# Patient Record
Sex: Female | Born: 2005
Health system: Southern US, Community
[De-identification: ages and names within clinical notes are randomized; demographics above are authoritative.]

## PROBLEM LIST (undated history)

## (undated) DIAGNOSIS — E039 Hypothyroidism, unspecified: Secondary | ICD-10-CM

## (undated) DIAGNOSIS — R6250 Unspecified lack of expected normal physiological development in childhood: Secondary | ICD-10-CM

## (undated) DIAGNOSIS — E049 Nontoxic goiter, unspecified: Secondary | ICD-10-CM

## (undated) DIAGNOSIS — E11649 Type 2 diabetes mellitus with hypoglycemia without coma: Secondary | ICD-10-CM

## (undated) DIAGNOSIS — E109 Type 1 diabetes mellitus without complications: Secondary | ICD-10-CM

## (undated) DIAGNOSIS — E063 Autoimmune thyroiditis: Secondary | ICD-10-CM

## (undated) HISTORY — DX: Unspecified lack of expected normal physiological development in childhood: R62.50

## (undated) HISTORY — DX: Type 1 diabetes mellitus without complications: E10.9

## (undated) HISTORY — DX: Hypothyroidism, unspecified: E03.9

## (undated) HISTORY — DX: Autoimmune thyroiditis: E06.3

## (undated) HISTORY — PX: TYMPANOSTOMY TUBE PLACEMENT: SHX32

## (undated) HISTORY — DX: Nontoxic goiter, unspecified: E04.9

## (undated) HISTORY — DX: Type 2 diabetes mellitus with hypoglycemia without coma: E11.649

---
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
## 2024-01-23 ENCOUNTER — Encounter (INDEPENDENT_AMBULATORY_CARE_PROVIDER_SITE_OTHER): Payer: Self-pay | Admitting: Family

## 2024-01-23 ENCOUNTER — Ambulatory Visit (INDEPENDENT_AMBULATORY_CARE_PROVIDER_SITE_OTHER): Admitting: Family

## 2024-01-23 VITALS — BP 110/74 | HR 86 | Wt 110.8 lb

## 2024-01-23 DIAGNOSIS — E1065 Type 1 diabetes mellitus with hyperglycemia: Secondary | ICD-10-CM | POA: Diagnosis not present

## 2024-01-23 DIAGNOSIS — Z794 Long term (current) use of insulin: Secondary | ICD-10-CM

## 2024-01-23 MED ORDER — DEXCOM G7 SENSOR MISC: 5 refills | Status: AC

## 2024-01-23 MED ORDER — TRESIBA FLEXTOUCH 100 UNIT/ML ~~LOC~~ SOPN: PEN_INJECTOR | SUBCUTANEOUS | 2 refills | Status: AC

## 2024-01-23 MED ORDER — NOVOLOG FLEXPEN 100 UNIT/ML ~~LOC~~ SOPN: PEN_INJECTOR | SUBCUTANEOUS | 3 refills | Status: AC

## 2024-01-23 MED ORDER — TECHLITE PEN NEEDLES 31G X 5 MM MISC: 1 refills | Status: AC

## 2024-01-23 NOTE — Progress Notes (Signed)
 :[facility] Diabetes Consultation Follow-up Visit  Leah Pennington February 12, 2006 [id number]  Chief Complaint: Follow-up type 1 diabetes   Jeannine Milroy, MD   HPI: Leah Pennington  is a 18 y.o. female presenting for follow-up of type 1 diabetes. she is accompanied to this visit by her mother.  1. Leah Pennington was diagnosed with type 1 diabetes on 02/23/2009 at the age of 4-5/12. Dr. Jesse Moritz of the [facility] called our office on the evening of February 22, 2009. Parents had brought her to Dr. Trent Frizzle office that day for a 2-week history of polyuria and polydipsia, and a 2-night history of new-onset enuresis. At Dr. Trent Frizzle office, the capillary blood glucose test was 96, but the urine glucose was greater than 1000 and urine ketones were trace. Dr. Hansel Ley arranged to have blood work drawn and for the patient to come back the next morning.  It turned out that the serum glucose from the previous day was 696. We decided it was in the child's best interest to admit her to the hospital for further evaluation, management, and intensive diabetes education for new-onset T1DM. Since that time Leah Pennington has been followed in our [facility] clinic every 3-4 months.    2. Since last visit to [facility] on 10/2022 , she has been well.  No ER visits or hospitalizations.  She will be moving to [facility] in August for sonography. She is staying active with work.   She reports doing well with diabetes care and feels like the Dexcom G7 have been very helpful. She is wearing sensor more consistently since it is smaller. Takes Novolog  after eating, usually as soon as she finishes eating. Estimates eating 50-60 grams per meal. Hypoglycemia does not occur frequently, if she does go low its usually in the morning.     Insulin  regimen: 25 units of Tresiba . Novolog  120/30/6 plan.  Hypoglycemia:Able to feel low blood sugars.  No glucagon  needed recently.  Dexcom CGM   - Pattern of hyperglycemia between 4pm-9pm while she is working. She  reports this is due to eating/snacking while working and not taking Novolog .   Med-alert ID: Not currently wearing Injection sites: Arms, legs and abdomen.  Annual labs due: 10/2024 Ophthalmology due: 2023. Discussed importance of annual eye exam    3. ROS: Greater than 10 systems reviewed with pertinent positives listed in HPI, otherwise neg. Constitutional: Weight as above.  Sleeping well HEENT: No vision changes. No difficulty swallowing.  Respiratory: No increased work of breathing currently GI: No constipation or diarrhea Musculoskeletal: No joint deformity Neuro: Normal affect. No tremors. No headaches.  Endocrine: As above   Past Medical History:   Past Medical History:  Diagnosis Date   Diabetes mellitus type I (HCC)    Goiter    Hypoglycemia associated with diabetes (HCC)    Hypothyroid    Physical growth delay    Thyroiditis, autoimmune    Type 1 diabetes mellitus (HCC)     Medications:  Outpatient Encounter Medications as of 01/23/2024  Medication Sig   Continuous Glucose Sensor (FREESTYLE LIBRE 3 PLUS SENSOR) MISC Change sensor every 15 days. (Patient not taking: Reported on 01/23/2024)   norethindrone-ethinyl estradiol-FE (JUNEL FE 1/20) 1-20 MG-MCG tablet Take 1 tablet by mouth daily.   [DISCONTINUED] Continuous Glucose Sensor (DEXCOM G7 SENSOR) MISC Use 1 sensor as directed every 10 days to monitor glucose continuously.   [DISCONTINUED] insulin  aspart (NOVOLOG  FLEXPEN) 100 UNIT/ML FlexPen INJECT SUBCUTANEOUSLY UP TO 50 UNITS DAILY   [DISCONTINUED] insulin  degludec (TRESIBA  FLEXTOUCH) 100  UNIT/ML FlexTouch Pen INJECT UP TO 50 UNITS PER DAY.   [DISCONTINUED] TECHLITE PEN NEEDLES 31G X 5 MM MISC USE WITH INSULIN  PENS 6 TIMES DAILY   azelastine  (ASTELIN ) 0.1 % nasal spray Place 1 spray into both nostrils 2 (two) times daily as needed for rhinitis or allergies. Use in each nostril as directed (Patient not taking: Reported on 05/09/2023)   cetirizine  (ZYRTEC  ALLERGY ) 10  MG tablet Take 1 tablet (10 mg total) by mouth daily. (Patient not taking: Reported on 05/09/2023)   Continuous Glucose Sensor (DEXCOM G7 SENSOR) MISC Use 1 sensor as directed every 10 days to monitor glucose continuously.   fluticasone  (FLONASE ) 50 MCG/ACT nasal spray Place 2 sprays into both nostrils daily. (Patient not taking: Reported on 05/09/2023)   Glucagon  (GVOKE HYPOPEN  2-PACK) 1 MG/0.2ML SOAJ Inject 1 Dose into the skin as needed. (Patient not taking: Reported on 01/23/2024)   insulin  aspart (NOVOLOG  FLEXPEN) 100 UNIT/ML FlexPen INJECT SUBCUTANEOUSLY UP TO 50 UNITS DAILY   insulin  degludec (TRESIBA  FLEXTOUCH) 100 UNIT/ML FlexTouch Pen Inject up to 50 Units per day.   Insulin  Pen Needle (TECHLITE PEN NEEDLES) 31G X 5 MM MISC USE WITH INSULIN  PENS 6 TIMES DAILY   Lancets (FREESTYLE) lancets USE AS DIRECTED (Patient not taking: No sig reported)   levonorgestrel (KYLEENA) 19.5 MG IUD 1 each by Intrauterine route once. (Patient not taking: Reported on 10/31/2022)   LO LOESTRIN FE 1 MG-10 MCG / 10 MCG tablet Take 1 tablet by mouth daily. (Patient not taking: Reported on 05/09/2023)   Olopatadine  HCl 0.2 % SOLN Apply 1 drop to eye daily as needed (itchy watery eyes). (Patient not taking: Reported on 05/09/2023)   ONETOUCH VERIO test strip CHECK BLOOD SUGAR 6 TIMES DAILY (Patient not taking: Reported on 01/23/2024)   No facility-administered encounter medications on file as of 01/23/2024.    Allergies: Allergies  Allergen Reactions   Erythromycin Nausea Only    Surgical History: Past Surgical History:  Procedure Laterality Date   TYMPANOSTOMY TUBE PLACEMENT      Family History:  Family History  Problem Relation Age of Onset   Diabetes Mother        Gestational DM   Hyperthyroidism Mother        recent dx    Hypertension Maternal Grandmother    Hypertension Maternal Grandfather    Hypertension Paternal Grandmother    Thyroid disease Paternal Grandmother    Cancer Paternal Grandmother     Hypertension Paternal Grandfather    Thyroid disease Paternal Aunt    Breast cancer Paternal Aunt       Social History: Lives with: mother and father  Davia Erps in college.   Physical Exam:  Vitals:   01/23/24 1008  BP: 110/74  Pulse: 86  Weight: 110 lb 12.8 oz (50.3 kg)      BP 110/74 (BP Location: Left Arm, Patient Position: Sitting, Cuff Size: Normal)   Pulse 86   Wt 110 lb 12.8 oz (50.3 kg)   BMI 13.14 kg/m  Body mass index: body mass index is 13.14 kg/m. Blood pressure %iles are not available for patients who are 18 years or older.  Ht Readings from Last 3 Encounters:  12/01/22 6\' 5"  (1.956 m) (>99%, Z= 5.03)*  10/31/22 5' 5.28" (1.658 m) (66%, Z= 0.41)*  03/14/22 5' 5.32" (1.659 m) (67%, Z= 0.45)*   * Growth percentiles are based on CDC (Girls, 2-20 Years) data.   Wt Readings from Last 3 Encounters:  01/23/24 110 lb 12.8  oz (50.3 kg) (18%, Z= -0.93)*  11/19/23 110 lb 14.4 oz (50.3 kg) (18%, Z= -0.90)*  05/09/23 112 lb 12.8 oz (51.2 kg) (24%, Z= -0.71)*   * Growth percentiles are based on CDC (Girls, 2-20 Years) data.   Physical Exam General: Well developed, well nourished female in no acute distress.   Head: Normocephalic, atraumatic.   Eyes:  Pupils equal and round. EOMI.   Sclera white.  No eye drainage.   Ears/Nose/Mouth/Throat: Nares patent, no nasal drainage.  Normal dentition, mucous membranes moist.   Neck: supple, no cervical lymphadenopathy, no thyromegaly Cardiovascular: regular rate, normal S1/S2, no murmurs Respiratory: No increased work of breathing.  Lungs clear to auscultation bilaterally.  No wheezes. Abdomen: soft, nontender, nondistended. No appreciable masses  Extremities: warm, well perfused, cap refill < 2 sec.   Musculoskeletal: Normal muscle mass.  Normal strength Skin: warm, dry.  No rash or lesions. Neurologic: alert and oriented, normal speech, no tremor    Labs:  Results for orders placed or performed in visit on 11/19/23   POCT Glucose (Device for Home Use)   Collection Time: 11/19/23  2:31 PM  Result Value Ref Range   Glucose Fasting, POC 99 70 - 99 mg/dL   POC Glucose    POCT glycosylated hemoglobin (Hb A1C)   Collection Time: 11/19/23  2:38 PM  Result Value Ref Range   Hemoglobin A1C 8.6 (A) 4.0 - 5.6 %   HbA1c POC (<> result, manual entry)     HbA1c, POC (prediabetic range)     HbA1c, POC (controlled diabetic range)    TSH   Collection Time: 11/19/23  2:56 PM  Result Value Ref Range   TSH 0.76 mIU/L  T4, free   Collection Time: 11/19/23  2:56 PM  Result Value Ref Range   Free T4 1.4 0.8 - 1.4 ng/dL  Lipid panel   Collection Time: 11/19/23  2:56 PM  Result Value Ref Range   Cholesterol 138 <170 mg/dL   HDL 52 >98 mg/dL   Triglycerides 80 <11 mg/dL   LDL Cholesterol (Calc) 70 <914 mg/dL (calc)   Total CHOL/HDL Ratio 2.7 <5.0 (calc)   Non-HDL Cholesterol (Calc) 86 <782 mg/dL (calc)  COMPLETE METABOLIC PANEL WITH GFR   Collection Time: 11/19/23  2:56 PM  Result Value Ref Range   Glucose, Bld 89 65 - 99 mg/dL   BUN 7 7 - 20 mg/dL   Creat 9.56 2.13 - 0.86 mg/dL   BUN/Creatinine Ratio SEE NOTE: 6 - 22 (calc)   Sodium 138 135 - 146 mmol/L   Potassium 4.5 3.8 - 5.1 mmol/L   Chloride 104 98 - 110 mmol/L   CO2 26 20 - 32 mmol/L   Calcium 9.4 8.9 - 10.4 mg/dL   Total Protein 7.3 6.3 - 8.2 g/dL   Albumin 4.4 3.6 - 5.1 g/dL   Globulin 2.9 2.0 - 3.8 g/dL (calc)   AG Ratio 1.5 1.0 - 2.5 (calc)   Total Bilirubin 0.8 0.2 - 1.1 mg/dL   Alkaline phosphatase (APISO) 69 36 - 128 U/L   AST 12 12 - 32 U/L   ALT 9 5 - 32 U/L     Assessment/Plan: Alexes is a 18 y.o. female with uncontrolled type 1 diabetes on MDI. Pattern of hypoglycemia between 7am-9am. She has a pattern of hyperglycemia between 4pm-9pm which is due to eating at work without taking Novolog . Her time in target range is 29%, below goal of >70%.     1. DM w/o  complication type I, uncontrolled (HCC) 2. Hyperglycemia  - Reviewed  CGM download. Discussed trends and patterns.  - Rotate injection sites to prevent scar tissue.  - bolus 15 minutes prior to eating to limit blood sugar spikes.  - Reviewed carb counting and importance of accurate carb counting.  - Discussed signs and symptoms of hypoglycemia. Always have glucose available.  - POCT glucose and hemoglobin A1c  - Reviewed growth chart.  - [facility] to [facility]. Discussed transition to adult endocrinology.  - Discussed insulin  pump therapy, she is not interested at this time.  Lab Orders  No laboratory test(s) ordered today    3 Insulin  dose change.  - Decrease Tresiba  to 24 units.    Follow-up:   3 months.   LOS: >33 spent today reviewing the medical chart, counseling the patient/family, and documenting today's visit.  This time does not include CGM interpretation.  When a patient is on insulin , intensive monitoring of blood glucose levels is necessary to avoid hyperglycemia and hypoglycemia. Severe hyperglycemia/hypoglycemia can lead to hospital admissions and be life threatening.   Candee Cha, DNP, FNP-C  Pediatric Specialist  [address]  [place], [address]  Tele: [phone]

## 2024-01-23 NOTE — Patient Instructions (Addendum)
 It was a pleasure seeing you in clinic today. Please do not hesitate to contact me if you have questions or concerns.   Please sign up for MyChart. This is a communication tool that allows you to send an email directly to me. This can be used for questions, prescriptions and blood sugar reports. We will also release labs to you with instructions on MyChart. Please do not use MyChart if you need immediate or emergency assistance. Ask our wonderful front office staff if you need assistance.   Decrease tresiba  to 24 units   Make sure to take Novolo when you eat at work!

## 2024-03-04 ENCOUNTER — Ambulatory Visit (INDEPENDENT_AMBULATORY_CARE_PROVIDER_SITE_OTHER): Admitting: Family
